# Patient Record
Sex: Female | Born: 1990 | Race: Black or African American | Hispanic: No | Marital: Single | Smoking: Current every day smoker
Health system: Southern US, Community
[De-identification: ages and names within clinical notes are randomized; demographics above are authoritative.]

## PROBLEM LIST (undated history)

## (undated) DIAGNOSIS — M069 Rheumatoid arthritis, unspecified: Secondary | ICD-10-CM

## (undated) DIAGNOSIS — M1990 Unspecified osteoarthritis, unspecified site: Secondary | ICD-10-CM

## (undated) HISTORY — PX: NO PAST SURGERIES: SHX2092

---

## 2010-10-20 ENCOUNTER — Emergency Department (HOSPITAL_COMMUNITY): Payer: Self-pay

## 2010-10-20 ENCOUNTER — Inpatient Hospital Stay (HOSPITAL_COMMUNITY)
Admission: EM | Admit: 2010-10-20 | Discharge: 2010-10-22 | DRG: 547 | Disposition: A | Discharge status: Home / Self Care | Payer: Self-pay | Attending: Internal Medicine | Admitting: Internal Medicine

## 2010-10-20 DIAGNOSIS — M069 Rheumatoid arthritis, unspecified: Principal | ICD-10-CM | POA: Diagnosis present

## 2010-10-20 DIAGNOSIS — Z9119 Patient's noncompliance with other medical treatment and regimen: Secondary | ICD-10-CM

## 2010-10-20 DIAGNOSIS — R269 Unspecified abnormalities of gait and mobility: Secondary | ICD-10-CM | POA: Diagnosis present

## 2010-10-20 DIAGNOSIS — M25569 Pain in unspecified knee: Secondary | ICD-10-CM | POA: Diagnosis present

## 2010-10-20 DIAGNOSIS — Z91199 Patient's noncompliance with other medical treatment and regimen due to unspecified reason: Secondary | ICD-10-CM

## 2010-10-20 LAB — GRAM STAIN

## 2010-10-20 LAB — COMPREHENSIVE METABOLIC PANEL
ALT: 9 U/L (ref 0–35)
AST: 13 U/L (ref 0–37)
CO2: 26 mEq/L (ref 19–32)
Chloride: 101 mEq/L (ref 96–112)
Creatinine, Ser: 0.59 mg/dL (ref 0.50–1.10)
GFR calc Af Amer: >60 mL/min (ref >60)
GFR calc non Af Amer: >60 mL/min (ref >60)
Glucose, Bld: 95 mg/dL (ref 70–99)
Sodium: 136 mEq/L (ref 135–145)
Total Bilirubin: 0.2 mg/dL — ABNORMAL LOW (ref 0.3–1.2)

## 2010-10-20 LAB — GLUCOSE, CAPILLARY: Glucose-Capillary: 97 mg/dL (ref 70–99)

## 2010-10-20 LAB — SYNOVIAL CELL COUNT + DIFF, W/ CRYSTALS
Crystals, Fluid: NONE SEEN
Eosinophils-Synovial: 0 % (ref 0–1)
Lymphocytes-Synovial Fld: 12 % (ref 0–20)
Monocyte-Macrophage-Synovial Fluid: 13 % — ABNORMAL LOW (ref 50–90)

## 2010-10-20 LAB — CBC
Hemoglobin: 10.3 g/dL — ABNORMAL LOW (ref 12.0–15.0)
MCV: 74.6 fL — ABNORMAL LOW (ref 78.0–100.0)
Platelets: 441 10*3/uL — ABNORMAL HIGH (ref 150–400)
RBC: 4.53 MIL/uL (ref 3.87–5.11)
WBC: 6.7 10*3/uL (ref 4.0–10.5)

## 2010-10-20 LAB — POCT PREGNANCY, URINE: Preg Test, Ur: NEGATIVE

## 2010-10-20 LAB — SEDIMENTATION RATE: Sed Rate: 57 mm/hr — ABNORMAL HIGH (ref 0–22)

## 2010-10-21 LAB — RENAL FUNCTION PANEL
BUN: 7 mg/dL (ref 6–23)
CO2: 26 mEq/L (ref 19–32)
Calcium: 9.6 mg/dL (ref 8.4–10.5)
GFR calc Af Amer: >60 mL/min (ref >60)
Glucose, Bld: 124 mg/dL — ABNORMAL HIGH (ref 70–99)
Phosphorus: 2.1 mg/dL — ABNORMAL LOW (ref 2.3–4.6)

## 2010-10-21 LAB — GLUCOSE, CAPILLARY: Glucose-Capillary: 147 mg/dL — ABNORMAL HIGH (ref 70–99)

## 2010-10-21 LAB — SEDIMENTATION RATE: Sed Rate: >140 mm/hr — ABNORMAL HIGH (ref 0–22)

## 2010-10-22 LAB — GLUCOSE, CAPILLARY
Glucose-Capillary: 91 mg/dL (ref 70–99)
Glucose-Capillary: 105 mg/dL — ABNORMAL HIGH (ref 70–99)

## 2010-10-23 NOTE — Discharge Summary (Signed)
Caitlin Miles NO.:  [id number]  MEDICAL RECORD NO.:  [id number]  LOCATION:  1312                         FACILITY:  [facility]  PHYSICIAN:  Altha Harm, MDDATE OF BIRTH:  January 13, 1991  DATE OF ADMISSION:  10/20/2010 DATE OF DISCHARGE:  10/22/2010                              DISCHARGE SUMMARY   DISCHARGE DISPOSITION:  Home.  FINAL DISCHARGE DIAGNOSES: 1. Rheumatoid arthritis with inflammation of right knee. 2. Medication noncompliance. 3. Gait abnormality secondary to pain in the right knee.  DISCHARGE MEDICATIONS: 1. Folic acid 1 mg p.o. daily. 2. Plaquenil 200 mg p.o. daily. 3. Methotrexate 7.5 mg p.o. weekly on Tuesdays. 4. Tylenol 500 1-2 tablets p.o. daily for pain. 5. Oxycodone 5 mg 1-2 tablets p.o. q.6 h. p.r.n. pain. 6. Prednisone 5 mg p.o. daily.  CONSULTANTS:  Harvie Junior, M.D., Orthopedic Surgery.  PROCEDURES:  Intra-articular steroid injection.  DIAGNOSTIC STUDIES:  Three view x-ray of the knee done on admission, which shows joint effusion.  Impression:  Joint space narrowing involving both the medial and lateral compartments with a pattern compatible with an inflammatory arthropathy.  There is also likely component of osteoarthritis with marginal spur formation.  PERTINENT LABORATORY STUDIES:  ESR baseline is greater than 140.  Sodium 136, potassium 3.9, chloride 103, bicarb 26, BUN 7, creatinine 0.71. WBC 6.7, hemoglobin 10.3, hematocrit 33.8, platelet count 441.  Joint fluid showed red, cloudy fluid.  White blood cell count total of 21,614, neutrophils 75, lymphocytes 12, macrophage monocytes 13, eosinophils 0. Fluid Gram stain showed abundant WBCs present, no organisms seen.  CHIEF COMPLAINT:  Pain in the right knee with an inability to ambulate.  HISTORY OF PRESENT ILLNESS:  Please refer to the H and P by Dr. Nedra Hai for details of the HPI; however, in short, this is a 20 year old female with a history of rheumatoid  arthritis who has been noncompliant with the medications secondary to affordability.  Presented to the emergency room with pain and swelling of the right knee.  HOSPITAL COURSE: 1. The patient was admitted by Dr. Nedra Hai on observation basis, she was     started on IV steroids in addition to IV fluids.  The patient had     some decrease in her pain; however, based on the findings of the     joint fluid, it is clearly not infectious, was rather inflammatory     in nature.  I saw the patient on the day after her admission and     felt that the definitive treatment would be an intra-articular     joint injection with steroids.  I discussed this with Dr. Azzie Roup, Rheumatology, who agreed.  We consulted Dr. Luiz Blare and he     did an intra-articular joint injection today, and will follow the     patient in 2 weeks in the office.  Pertaining to her rheumatoid     arthritis in general, the patient had been off all her medications.     I started her back on Plaquenil 200 mg daily and methotrexate 7.5     mg weekly on Tuesdays.  The patient has been given Dr. Vincenza Hews  Anderson's number, he will see her in the office and she has been     instructed, both her and her mother that they must call Dr. Azzie Roup at the number provided, which is [phone] to call for     an appointment. 2. In terms of her gait pattern, the patient was seen and evaluated by     Physical Therapy and crutches have been issued.  The patient is     proficient in ambulation with crutches both on a leveled surface     and on stairs. 3. Psychosocial.  The patient's only problem that she has a chronic     disease without any assess for ongoing health care.  The patient     was seen by the financial counselor here and given advice on how to     proceed.  This patient actually meets disability criteria for at     least partial disability criteria and I have advised the patient     and her mother that they should  likely pursue that route.  CONDITION AT THE TIME OF DISCHARGE:  Stable.  PHYSICAL EXAMINATION:  VITAL SIGNS:  Temperature is 98.2, heart rate 56, blood pressure 105/66, respiratory rate 17, O2 sats 100% on room air. LUNGS:  Clear to auscultation.  No wheezing or rhonchi noted. CARDIOVASCULAR:  She has got a normal S1, S2.  No murmurs, rubs, or gallops noted.  PMI is nondisplaced.  No heaves or thrills on palpation. ABDOMEN:  Obese, soft, nontender, nondistended.  No masses.  No hepatosplenomegaly.  Right knee show some swelling, but, however, there is no warmth and erythema.  Post injection, the patient is able to move the knee with very little difficulty.  Otherwise, the patient is stable.  DIETARY RESTRICTIONS:  None.  PHYSICAL RESTRICTIONS:  Activity as tolerated.  FOLLOWUP:  The patient is to follow up with Dr. Azzie Roup next week and they are to call for the appointment as the number provided.  The patient is also to call Dr. Luiz Blare' office in followup in 2 weeks as she is instructed.  Total time for this discharge process including face-to-face time 40 minutes.     Altha Harm, MD     MAM/MEDQ  D:  10/22/2010  T:  10/23/2010  Job:  [id number]  cc:   Lurena Nida, MD Fax: [phone]  Harvie Junior, M.D. Fax: [phone]  Electronically Signed by Marthann Schiller MD on 10/23/2010 03:51:00 PM

## 2010-10-24 LAB — BODY FLUID CULTURE: Culture: NO GROWTH

## 2010-11-04 NOTE — Consult Note (Signed)
  NAME:  LAQUEENA, HINCHEY            ACCOUNT NO.:  [id number]  MEDICAL RECORD NO.:  [id number]  LOCATION:                                 FACILITY:  PHYSICIAN:  Harvie Junior, M.D.   DATE OF BIRTH:  [birth year]  DATE OF CONSULTATION: DATE OF DISCHARGE:                                CONSULTATION   INJECTION NOTE After careful discussion about the indications for injection therapy and understanding the risks of bleeding, infection, need for further injection, failure of the injection to cure her, was desirous of undergoing a right knee injection after careful prepping of the skin with Betadine and a needle was injected into the superior lateral portal and the medicine was injected, 8 mL of 0.25% Marcaine and 2 mL of 80 mg/cc Depo-Medrol was instilled into the knee.  We then put a bandage on the knee and observed her for 10 minutes to make sure she has not had problems with the injection.     Harvie Junior, M.D.     Ranae Plumber  D:  10/22/2010  T:  10/23/2010  Job:  [id number]  Electronically Signed by Jodi Geralds M.D. on 11/04/2010 04:54:09 PM

## 2010-11-04 NOTE — Consult Note (Signed)
Caitlin Miles, Caitlin Miles NO.:  [id number]  MEDICAL RECORD NO.:  [id number]  LOCATION:  1312                         FACILITY:  [facility]  PHYSICIAN:  Harvie Junior, M.D.   DATE OF BIRTH:  Mar 19, 1991  DATE OF CONSULTATION: DATE OF DISCHARGE:  10/22/2010                                CONSULTATION   HISTORY OF PRESENT ILLNESS:  She is a 20 year old female who was admitted to the hospitalist service 2 days ago with a right swollen knee.  She has a long history of having a diagnosis of inflammatory arthritis, specifically rheumatoid arthritis, and has been treated in the past with medications for same.  She had a significant flare-up of knee pain.  She was seen in the emergency room where she underwent aspiration of her knee.  For about one month, she has been unable to work because it has been bothering her so much.  She presented to the emergency room because of the chronic pain as well.  She denied any fevers or chills, nausea or vomiting, or other symptomatology.  She has not been seen by rheumatologist for several months due to financial issues, and she has had a workup in the emergency room, which included a normal white blood cell count, temperature 99.1, x-ray which showed a knee joint effusion suggestive of inflammatory process with a small component of osteoarthritic change.  She was ultimately aspirated which showed 21614 white cells, 75% neutrophils, 12 lymphs, 13 monocytes/macrophages, no eosinophils or other cells.  The Gram stain was negative on that aspiration.  She had a sed rate done at that time which was greater than 140, and she was admitted to the Hematology/Oncology service, and we are consulted for treatment of the knee at this point for further evaluation.  PAST MEDICAL HISTORY:  Remarkable for having rheumatoid arthritis as outlined above.  She has no other significant past medical history.  SOCIAL HISTORY:  She is single with no children.   She is not a smoker or drinker.  She works in __________.  MEDICATIONS:  She is supposed to be on Plaquenil, Enbrel, and methotrexate; but is unclear what she is taking relative to the financial issues.  ALLERGIES:  She is allergic to Naprosyn which causes her to have itching.  PHYSICAL EXAMINATION:  GENERAL:  She is alert and oriented x3. VITAL SIGNS:  Temperature is 97.9, blood pressure is 124/79, heart rate 58, respirations 20.  She has 100% O2 sat on room air. HEENT:  Within normal limits.  Her eyes are not dilated. LUNGS:  Clear to auscultation. HEART:  Regular rate and rhythm. ABDOMEN:  Soft and nontender.  She is not using accessory muscle respirations. EXTREMITIES:  The right knee shows a boggy feeling, synovitic knee with no significant effusion.  No instability.  No significant joint line tenderness.  There is pain through range of motion.  LABORATORY DATA:  X-rays of right knee show a joint effusion with some joint space narrowing involving both medial and lateral compartments. No evidence of fracture.  She has laboratory data which includes a white blood cell count of 6.7, sed rate of greater than 140.  Chem-7 shows normal except for a glucose of  124.  She has a total bili which is low at 0.2, and an albumin which is 3.2.  ASSESSMENT:  She is a 20 year old with known rheumatoid arthritis.  She has a flare-up of right knee pain, has been aspirated.  We had a long talk about treatment options, I feel the most appropriate course of action for her is going to be injection therapy.  __________ on the right knee with 8 of Marcaine and 2 of Kenalog, and we will plan on following her up in the office in 2 weeks.  A card was given to her if she has problems sooner then she knows to call, and she should watch for any kind of fevers, chills, or worsening pain; and if she has anything she knows to call us.  I will plan on seeing her back in the office in 2 weeks.     Harvie Junior, M.D.     Ranae Plumber  D:  10/22/2010  T:  10/23/2010  Job:  [id number]  Electronically Signed by Jodi Geralds M.D. on 11/04/2010 06:28:05 PM

## 2010-11-12 NOTE — H&P (Signed)
NAMESHUNTIA, EXTON NO.:  [id number]  MEDICAL RECORD NO.:  [id number]  LOCATION:  WLED                         FACILITY:  [facility]  PHYSICIAN:  Houston Siren, MD           DATE OF BIRTH:  10-25-1990  DATE OF ADMISSION:  10/20/2010 DATE OF DISCHARGE:                             HISTORY & PHYSICAL   PRIMARY CARE PHYSICIAN:  Dr. Janace Litten Dayal  in [place].  ADVANCE DIRECTIVE:  Full code.  REASON FOR ADMISSION:  Noncompliant with medication and having exacerbation of her rheumatoid arthritis.  HISTORY OF PRESENT ILLNESS:  This is a 20 year old female with history of rheumatoid arthritis, noncompliant with her medications, having swelling of her right knee for 1 month (able to work up to 2 weeks ago at [employer]), presents to the emergency room at the insistence of her roommate and her mother because of the chronic pain and swelling of her right knee.  She denied fever, chills, nausea or vomiting, or any other symptomatology.  She had not seen her rheumatologist for several months due to her financial constraints.  Workup in the emergency room included a normal white count and temp of 99.1.  Right knee x-ray showed joint effusion suggestive of inflammatory process, but this has also a small component of osteoarthritic changes as well.  The right knee was aspirated.  Hospitalist was asked to admit the patient.  PAST MEDICAL HISTORY:  As above.  SOCIAL HISTORY:  She is single, has no children.  She denied tobacco, alcohol, or drug use.  She works at [employer].  MEDICATIONS:  She is supposed to be on Plaquenil, Enbrel, and methotrexate.  ALLERGIES:  NAPROXEN causing itching.  PHYSICAL EXAMINATION:  VITAL SIGNS:  Temperature 99.1, blood pressure 120/70, pulse is 75, respiratory rate is 16. GENERAL:  She is alert and oriented and is in no apparent distress.  She does not look particularly in great pain. HEENT:  Sclerae nonicteric.  Thyroid is not enlarged.  Throat is  clear. NECK:  Supple. CARDIAC:  S1 and S2 regular.  No murmur, rub, or gallop. LUNGS:  Clear. ABDOMEN:  Soft, nondistended, nontender. EXTREMITIES:  Right knee with effusion, slightly warm, full range of motion.  No concomitant erythema of overlying skin.  She has no calf tenderness.  Good distal pulses bilaterally. SKIN:  Warm and dry.  No evidence of shock. NEUROLOGIC AND PSYCHIATRIC:  Unremarkable as well.  OBJECTIVE FINDINGS AND PERTINENT STUDIES:  White count of 6700, hemoglobin of 10.3, creatinine of 0.6.  Liver function tests are normal. X-ray showed joint effusion.  Aspiration study is pending.  IMPRESSION:  This is a 20 year old with rheumatoid arthritis, has been noncompliant, and experiencing a flare of her right knee.  If her aspiration is negative, she really does not need to be admitted, however, the emergency room physician insisted that I admit her, and so I did.  We will give one dose of Solu-Medrol at 62.5 mg and then will start her on prednisone at 60 mg.  While she is in the hospital, we will give her intravenous Dilaudid and intravenous fluids.  She is very stable, and while she is in the hospital, I will give  her prophylactic Lovenox.  We will admit her to [facility].     Houston Siren, MD     PL/MEDQ  D:  10/20/2010  T:  10/20/2010  Job:  [id number]  Electronically Signed by Houston Siren  on 11/12/2010 09:43:38 PM

## 2011-03-29 ENCOUNTER — Emergency Department (HOSPITAL_COMMUNITY)
Admission: EM | Admit: 2011-03-29 | Discharge: 2011-03-29 | Disposition: A | Discharge status: Home / Self Care | Payer: Self-pay | Attending: Emergency Medicine | Admitting: Emergency Medicine

## 2011-03-29 ENCOUNTER — Encounter: Payer: Self-pay | Admitting: Emergency Medicine

## 2011-03-29 DIAGNOSIS — M083 Juvenile rheumatoid polyarthritis (seronegative): Secondary | ICD-10-CM | POA: Insufficient documentation

## 2011-03-29 DIAGNOSIS — Z79899 Other long term (current) drug therapy: Secondary | ICD-10-CM | POA: Insufficient documentation

## 2011-03-29 DIAGNOSIS — M25569 Pain in unspecified knee: Secondary | ICD-10-CM | POA: Insufficient documentation

## 2011-03-29 DIAGNOSIS — M25469 Effusion, unspecified knee: Secondary | ICD-10-CM | POA: Insufficient documentation

## 2011-03-29 DIAGNOSIS — M069 Rheumatoid arthritis, unspecified: Secondary | ICD-10-CM

## 2011-03-29 DIAGNOSIS — M25461 Effusion, right knee: Secondary | ICD-10-CM

## 2011-03-29 HISTORY — DX: Unspecified osteoarthritis, unspecified site: M19.90

## 2011-03-29 MED ORDER — LIDOCAINE HCL 2 % IJ SOLN
2.0000 mL | Freq: Once | INTRAMUSCULAR | Status: AC
  Administered 2011-03-29: 2 mg

## 2011-03-29 MED ORDER — OXYCODONE-ACETAMINOPHEN 5-325 MG PO TABS: 2.0000 | ORAL_TABLET | ORAL | Status: AC | PRN

## 2011-03-29 MED ORDER — MORPHINE SULFATE 4 MG/ML IJ SOLN
4.0000 mg | Freq: Once | INTRAMUSCULAR | Status: AC
  Administered 2011-03-29: 4 mg via INTRAMUSCULAR
  Filled 2011-03-29: qty 1

## 2011-03-29 NOTE — ED Provider Notes (Signed)
History     CSN: [id number]  Arrival date & time 03/29/11  1310   First MD Initiated Contact with Patient 03/29/11 1534      No chief complaint on file.   (Consider location/radiation/quality/duration/timing/severity/associated sxs/prior treatment) The history is provided by the patient.   The patient is a 21 year old, female, with juvenile rheumatoid arthritis.  She presents to emergency department complaining of right knee pain and swelling for several days.  She denies trauma.  She denies fever or rash.  She has had it aspirated in the past.  She has not changed any medications recently.   No past surgical history on file.  No family history on file.  History  Substance Use Topics  . Smoking status: Not on file  . Smokeless tobacco: Not on file  . Alcohol Use: Not on file    OB History    No data available      Review of Systems  Constitutional: Negative for fever and chills.  Cardiovascular: Negative for leg swelling.  Musculoskeletal: Positive for joint swelling.       Right knee pain and swelling     Allergies  Naproxen  Home Medications   Current Outpatient Rx  Name Route Sig Dispense Refill  . ETANERCEPT 50 MG/ML [place] SOLN Subcutaneous Inject 50 mg into the skin once a week. Taken on Wednesdays.     Marland Kitchen HYDROXYCHLOROQUINE SULFATE 200 MG PO TABS Oral Take 200 mg by mouth daily.      . IBUPROFEN 200 MG PO TABS Oral Take 200-400 mg by mouth every 8 (eight) hours as needed. For pain.     Marland Kitchen METHOTREXATE 2.5 MG PO TABS Oral Take 25 mg by mouth once a week. Caution:Chemotherapy. Protect from light.  Taken on Fridays.     Marland Kitchen PREDNISONE 5 MG PO TABS Oral Take 5 mg by mouth daily.        There were no vitals taken for this visit.  Physical Exam  Constitutional: She is oriented to person, place, and time. She appears well-developed and well-nourished.  HENT:  Head: Normocephalic and atraumatic.  Eyes: Conjunctivae are normal. Pupils are equal, round, and reactive  to light.  Neck: Normal range of motion.  Pulmonary/Chest: Effort normal.  Abdominal: She exhibits no distension.  Musculoskeletal: Normal range of motion. She exhibits edema and tenderness.       Right knee swelling, tenderness and effusion.  Neurological: She is alert and oriented to person, place, and time.  Skin: Skin is warm and dry. No rash noted. No erythema.  Psychiatric: She has a normal mood and affect.    ED Course  Procedures (including critical care time) Rheumatoid arthritis, with right knee effusion.  No perform a arthrocentesis of her right knee and treat with increased analgesics as needed.  Labs Reviewed - No data to display   ARTHROCENTESIS Sterile prep with betadine. Local anesthesia - 2 cc lido without epi Needle aspiration of 70 cc yellow aspirate No complications Pt felt better after removal of effusion   MDM  Rheumatoid arthritis, with right knee effusion Resolved after arthrocentesis       Nicholes Stairs, MD 03/29/11 1611

## 2011-03-29 NOTE — ED Notes (Signed)
Patient here with ongoing right knee pain and swelling due to rheumatoid arthritis. No new injury-needs pain meds

## 2011-10-02 ENCOUNTER — Encounter (HOSPITAL_COMMUNITY): Payer: Self-pay

## 2011-10-02 ENCOUNTER — Inpatient Hospital Stay (HOSPITAL_COMMUNITY)
Admission: AD | Admit: 2011-10-02 | Discharge: 2011-10-02 | Disposition: A | Discharge status: Home / Self Care | Payer: Self-pay | Source: Ambulatory Visit | Attending: Obstetrics & Gynecology | Admitting: Obstetrics & Gynecology

## 2011-10-02 DIAGNOSIS — D649 Anemia, unspecified: Secondary | ICD-10-CM

## 2011-10-02 DIAGNOSIS — N970 Female infertility associated with anovulation: Secondary | ICD-10-CM

## 2011-10-02 DIAGNOSIS — N938 Other specified abnormal uterine and vaginal bleeding: Secondary | ICD-10-CM | POA: Insufficient documentation

## 2011-10-02 DIAGNOSIS — N949 Unspecified condition associated with female genital organs and menstrual cycle: Secondary | ICD-10-CM

## 2011-10-02 HISTORY — DX: Rheumatoid arthritis, unspecified: M06.9

## 2011-10-02 LAB — WET PREP, GENITAL: Yeast Wet Prep HPF POC: NONE SEEN

## 2011-10-02 LAB — CBC
MCHC: 30.7 g/dL (ref 30.0–36.0)
RDW: 18.4 % — ABNORMAL HIGH (ref 11.5–15.5)

## 2011-10-02 MED ORDER — MEDROXYPROGESTERONE ACETATE 10 MG PO TABS: 10.0000 mg | ORAL_TABLET | Freq: Every day | ORAL | Status: DC

## 2011-10-02 MED ORDER — PROMETHAZINE HCL 25 MG PO TABS: 25.0000 mg | ORAL_TABLET | Freq: Four times a day (QID) | ORAL | Status: DC | PRN

## 2011-10-02 NOTE — MAU Provider Note (Signed)
History     CSN: [id number]  Arrival date and time: 10/02/11 1230   None     Chief Complaint  Patient presents with  . Vaginal Bleeding  . Possible Pregnancy   HPI This is a 21 y.o. female who presents with c/o heavy menstrual bleeding for 2 weeks. Denies significant pain with this. Has never had this happen before. Does have a history of some irregular periods.   RN NOTE:  Patient states she started her period on 7-1 and continues to have light bleeding every day. No pain.       OB History    Grav Para Term Preterm Abortions TAB SAB Ect Mult Living   1 0 0 0 1 1 0 0 0 0       Past Medical History  Diagnosis Date  . Arthritis   . Rheumatoid arthritis     Past Surgical History  Procedure Date  . No past surgeries     Family History  Problem Relation Age of Onset  . Rheum arthritis Mother   . Other Neg Hx     History  Substance Use Topics  . Smoking status: Current Everyday Smoker -- 0.5 packs/day    Types: Cigarettes  . Smokeless tobacco: Not on file  . Alcohol Use: No    Allergies:  Allergies  Allergen Reactions  . Naproxen Swelling    Prescriptions prior to admission  Medication Sig Dispense Refill  . adalimumab (HUMIRA) 40 MG/0.8ML injection Inject 40 mg into the skin every 14 (fourteen) days.      . hydroxychloroquine (PLAQUENIL) 200 MG tablet Take 200 mg by mouth daily.        Marland Kitchen ibuprofen (ADVIL,MOTRIN) 200 MG tablet Take 200-400 mg by mouth every 8 (eight) hours as needed. For pain.       . methotrexate (RHEUMATREX) 2.5 MG tablet Take 25 mg by mouth once a week. Caution:Chemotherapy. Protect from light.  Take 6 tablets every Wed      . predniSONE (DELTASONE) 5 MG tablet Take 10 mg by mouth daily.         Review of Systems  Constitutional: Negative.   HENT: Negative.   Eyes: Negative.   Respiratory: Negative.   Cardiovascular: Negative.   Gastrointestinal:       Reports R sided abdominal pain radiating through to her back yesterday.   States is on DMARDs and Methotrexate.  Knows she is not supposed to get pregnant, but admits she has not used birth control for 3 years.  Genitourinary:       See HPI.  Musculoskeletal:       Patient has a history of RA, her rheumatologist is Dr. Dareen Piano here in [place].  Skin: Negative.   Neurological: Negative.   Endo/Heme/Allergies: Negative.   Psychiatric/Behavioral: Negative.    Physical Exam   Blood pressure 115/76, pulse 79, temperature 98.9 F (37.2 C), temperature source Oral, resp. rate 16, height 5\' 5"  (1.651 m), weight 67.223 kg (148 lb 3.2 oz), last menstrual period 09/22/2011, SpO2 100.00%.  Physical Exam  Constitutional: She is oriented to person, place, and time. She appears well-developed and well-nourished.  HENT:  Head: Normocephalic and atraumatic.  Neck: Normal range of motion. Neck supple.  Cardiovascular: Normal rate, regular rhythm, normal heart sounds and intact distal pulses.   GI: Soft. Bowel sounds are normal. She exhibits no distension and no mass. There is no tenderness. There is no rebound and no guarding.  Musculoskeletal: Normal range of motion.  No joint deformity or pain noted.  Neurological: She is alert and oriented to person, place, and time.  Skin: Skin is warm and dry.   Pelvic:  No active hemorrhage. Moderate blood in vault. Uterus normal size, nontender  MAU Course  Procedures  MDM   Results for orders placed during the hospital encounter of 10/02/11 (from the past 24 hour(s))  POCT PREGNANCY, URINE     Status: Normal   Collection Time   10/02/11  1:33 PM      Component Value Range   Preg Test, Ur NEGATIVE  NEGATIVE  WET PREP, GENITAL     Status: Abnormal   Collection Time   10/02/11  4:15 PM      Component Value Range   Yeast Wet Prep HPF POC NONE SEEN  NONE SEEN   Trich, Wet Prep NONE SEEN  NONE SEEN   Clue Cells Wet Prep HPF POC FEW (*) NONE SEEN   WBC, Wet Prep HPF POC FEW (*) NONE SEEN  CBC     Status: Abnormal    Collection Time   10/02/11  4:35 PM      Component Value Range   WBC 10.8 (*) 4.0 - 10.5 K/uL   RBC 4.60  3.87 - 5.11 MIL/uL   Hemoglobin 10.6 (*) 12.0 - 15.0 g/dL   HCT 96.0 (*) 45.4 - 09.8 %   MCV 75.0 (*) 78.0 - 100.0 fL   MCH 23.0 (*) 26.0 - 34.0 pg   MCHC 30.7  30.0 - 36.0 g/dL   RDW 11.9 (*) 14.7 - 82.9 %   Platelets 435 (*) 150 - 400 K/uL   Consulted with Wynelle Bourgeois CNM regarding plan of care.  Assessment and Plan   Assessment:  Anovulatory Menstrual Bleeding  Plan:  Provera 10mg  PO  daily x 10 days, told to expect withdrawal bleed. Phenergan 25 mg PO every 6-8 hours as needed for nausea. #30  Follow up or return here as needed. Seek a regular birth control method at [facility].      Servando Salina 10/02/2011, 5:27 PM   Seen and agree with note.

## 2011-10-02 NOTE — MAU Note (Signed)
Patient states she started her period on 7-1 and continues to have light bleeding every day. No pain.

## 2011-10-03 LAB — GC/CHLAMYDIA PROBE AMP, GENITAL: Chlamydia, DNA Probe: NEGATIVE

## 2012-07-12 ENCOUNTER — Encounter (HOSPITAL_COMMUNITY): Payer: Self-pay | Admitting: *Deleted

## 2012-07-12 ENCOUNTER — Emergency Department (HOSPITAL_COMMUNITY)
Admission: EM | Admit: 2012-07-12 | Discharge: 2012-07-12 | Disposition: A | Discharge status: Home / Self Care | Payer: Self-pay | Attending: Emergency Medicine | Admitting: Emergency Medicine

## 2012-07-12 ENCOUNTER — Emergency Department (HOSPITAL_COMMUNITY): Payer: Self-pay

## 2012-07-12 DIAGNOSIS — IMO0002 Reserved for concepts with insufficient information to code with codable children: Secondary | ICD-10-CM | POA: Insufficient documentation

## 2012-07-12 DIAGNOSIS — F17200 Nicotine dependence, unspecified, uncomplicated: Secondary | ICD-10-CM | POA: Insufficient documentation

## 2012-07-12 DIAGNOSIS — M25579 Pain in unspecified ankle and joints of unspecified foot: Secondary | ICD-10-CM | POA: Insufficient documentation

## 2012-07-12 DIAGNOSIS — Z79899 Other long term (current) drug therapy: Secondary | ICD-10-CM | POA: Insufficient documentation

## 2012-07-12 DIAGNOSIS — M069 Rheumatoid arthritis, unspecified: Secondary | ICD-10-CM | POA: Insufficient documentation

## 2012-07-12 DIAGNOSIS — M25571 Pain in right ankle and joints of right foot: Secondary | ICD-10-CM

## 2012-07-12 MED ORDER — ZOLPIDEM TARTRATE 5 MG PO TABS: 5.0000 mg | ORAL_TABLET | Freq: Every evening | ORAL | Status: DC | PRN

## 2012-07-12 MED ORDER — ADULT MULTIVITAMIN W/MINERALS CH: 1.0000 | ORAL_TABLET | Freq: Every day | ORAL | Status: DC

## 2012-07-12 MED ORDER — FOLIC ACID 1 MG PO TABS: 1.0000 mg | ORAL_TABLET | Freq: Every day | ORAL | Status: DC

## 2012-07-12 MED ORDER — ALUM & MAG HYDROXIDE-SIMETH 200-200-20 MG/5ML PO SUSP
30.0000 mL | ORAL | Status: DC | PRN
Start: 2012-07-12 — End: 2012-07-12

## 2012-07-12 MED ORDER — OXYCODONE-ACETAMINOPHEN 5-325 MG PO TABS
1.0000 | ORAL_TABLET | Freq: Once | ORAL | Status: AC
  Administered 2012-07-12: 1 via ORAL
  Filled 2012-07-12: qty 1

## 2012-07-12 MED ORDER — OXYCODONE-ACETAMINOPHEN 5-325 MG PO TABS: ORAL_TABLET | ORAL | Status: AC

## 2012-07-12 MED ORDER — LORAZEPAM 1 MG PO TABS: 0.0000 mg | ORAL_TABLET | Freq: Two times a day (BID) | ORAL | Status: DC

## 2012-07-12 MED ORDER — ONDANSETRON HCL 4 MG PO TABS: 4.0000 mg | ORAL_TABLET | Freq: Three times a day (TID) | ORAL | Status: DC | PRN

## 2012-07-12 MED ORDER — THIAMINE HCL 100 MG/ML IJ SOLN: 100.0000 mg | Freq: Every day | INTRAMUSCULAR | Status: DC

## 2012-07-12 MED ORDER — VITAMIN B-1 100 MG PO TABS: 100.0000 mg | ORAL_TABLET | Freq: Every day | ORAL | Status: DC

## 2012-07-12 MED ORDER — LORAZEPAM 2 MG/ML IJ SOLN: 1.0000 mg | Freq: Four times a day (QID) | INTRAMUSCULAR | Status: DC | PRN

## 2012-07-12 MED ORDER — LORAZEPAM 1 MG PO TABS: 0.0000 mg | ORAL_TABLET | Freq: Four times a day (QID) | ORAL | Status: DC

## 2012-07-12 MED ORDER — NICOTINE 21 MG/24HR TD PT24: 21.0000 mg | MEDICATED_PATCH | Freq: Every day | TRANSDERMAL | Status: DC

## 2012-07-12 MED ORDER — LORAZEPAM 1 MG PO TABS: 1.0000 mg | ORAL_TABLET | Freq: Four times a day (QID) | ORAL | Status: DC | PRN

## 2012-07-12 NOTE — ED Provider Notes (Signed)
History    This chart was scribed for non-physician practitioner Wynetta Emery, PA-C working with Celene Kras, MD by Gerlean Ren, ED Scribe. This patient was seen in room WTR9/WTR9 and the patient's care was started at 6:16 PM.    CSN: [id number]  Arrival date & time 07/12/12  1637   First MD Initiated Contact with Patient 07/12/12 1723      Chief Complaint  Patient presents with  . Ankle Pain     The history is provided by the patient. No language interpreter was used.  Caitlin Miles is a 22 y.o. female who presents to the Emergency Department complaining of right ankle pain RA flare up beginning at 5:00 AM this morning noticed when pt was unable to bear weight.  Pain rated as 8/10 and similar in quality to prior RA flare ups.  No recent falls, injuries, or traumas.  Pt has next appointment with rheumatologist 05/14.    Past Medical History  Diagnosis Date  . Arthritis   . Rheumatoid arthritis     Past Surgical History  Procedure Laterality Date  . No past surgeries      Family History  Problem Relation Age of Onset  . Rheum arthritis Mother   . Other Neg Hx     History  Substance Use Topics  . Smoking status: Current Every Day Smoker -- 0.50 packs/day    Types: Cigarettes  . Smokeless tobacco: Never Used  . Alcohol Use: No    OB History   Grav Para Term Preterm Abortions TAB SAB Ect Mult Living   1 0 0 0 1 1 0 0 0 0       Review of Systems  Constitutional: Negative for fever.  Respiratory: Negative for shortness of breath.   Cardiovascular: Negative for chest pain.  Gastrointestinal: Negative for nausea, vomiting, abdominal pain and diarrhea.  Musculoskeletal: Positive for arthralgias (right ankle, chronic).  All other systems reviewed and are negative.    Allergies  Naproxen  Home Medications   Current Outpatient Rx  Name  Route  Sig  Dispense  Refill  . ibuprofen (ADVIL,MOTRIN) 200 MG tablet   Oral   Take 200-400 mg by mouth every 8 (eight)  hours as needed. For pain.          . Methotrexate, Anti-Rheumatic, (METHOTREXATE, PF, [place])   Subcutaneous   Inject 6 mLs into the skin once a week. On tuesdays         . predniSONE (DELTASONE) 5 MG tablet   Oral   Take 10 mg by mouth every morning.            BP 122/71  Pulse 71  Temp(Src) 99 F (37.2 C) (Oral)  Resp 18  Ht 5\' 5"  (1.651 m)  Wt 135 lb (61.236 kg)  BMI 22.47 kg/m2  SpO2 100%  LMP 07/08/2012  Physical Exam  Nursing note and vitals reviewed. Constitutional: She is oriented to person, place, and time. She appears well-developed and well-nourished. No distress.  HENT:  Head: Normocephalic.  Eyes: Conjunctivae and EOM are normal.  Cardiovascular: Normal rate.   Pulmonary/Chest: Effort normal. No stridor.  Abdominal: Soft. Bowel sounds are normal.  Musculoskeletal: Normal range of motion.  Patient and relates with a mildly antalgic gait.   No swelling, erythema or tenderness to palpation of the right ankle or heel. Patient states the pain is worse in the distal Achilles tendon.   Neurovascularly intact  Neurological: She is alert and oriented to person, place,  and time.  Psychiatric: She has a normal mood and affect.    ED Course  Procedures (including critical care time) DIAGNOSTIC STUDIES: Oxygen Saturation is 100% on room air, normal by my interpretation.    COORDINATION OF CARE: 6:19 PM- Informed pt that I will provide pain relief and pain medicine prescription.  Informed pt that the flare up will likely pass and that resting the ankle will help with pain relief.  Advised pt to use crutches at home.  Pt verbalizes understanding and agrees with plan.     Dg Ankle Complete Right  07/12/2012  *RADIOLOGY REPORT*  Clinical Data: Rheumatoid arthritis.  Ankle pain.  RIGHT ANKLE - COMPLETE 3+ VIEW  Comparison: None.  Findings: Plafond and talar dome intact.  Mild soft tissue swelling noted just distal to both malleoli.  There is dorsal spurring of the  talar head which may be degenerative but can also occasionally be encountered in the setting of tarsal coalition.  Bony demineralization is present.  As imaged, Boehler's angle is only 12 degrees, and this can be a sign of calcaneal fracture although might possibly be projectional.  Degenerative midfoot arthropathy noted.  Suspected tibiotalar joint effusion.  IMPRESSION:  1.  Boehler's angle is abnormally low, which can be indicative of a calcaneal fracture.  Correlate with patient history in deciding the need for cross sectional imaging. 2.  Tibiotalar joint effusion. 3.  Dorsal spurring of the talar head, possibly degenerative although similar spurring can occasionally be encountered in the setting of tarsal coalition.  Calcaneonavicular and anterior subtalar coalition cannot be totally excluded. 4.  Degenerative midfoot arthropathy. 5.  Mild soft tissue swelling below both malleoli.   Original Report Authenticated By: Gaylyn Rong, M.D.      1. Ankle joint pain, right   2. RA (rheumatoid arthritis)       MDM   Caitlin Miles is a 22 y.o. female with right ankle pain, no trauma, history of rheumatoid arthritis. X-ray shows abnormally low Boehler's angle, I discussed this with attending physician Dr. Lynelle Doctor who feels that calcaneal fracture and is unlike any believe given the lack of injury and no tenderness to palpation on exam. Encourage the patient to continue with her methotrexate and prednisone and will add on Percocet. She will follow with her rheumatologist this week, if symptoms persist.   Patient states that she has crutches at home, I have advised her to use these at all times, until pain subsides with weight bearing   Filed Vitals:   07/12/12 1703  BP: 122/71  Pulse: 71  Temp: 99 F (37.2 C)  TempSrc: Oral  Resp: 18  Height: 5\' 5"  (1.651 m)  Weight: 135 lb (61.236 kg)  SpO2: 100%     Pt verbalized understanding and agrees with care plan. Outpatient follow-up and return  precautions given.    Discharge Medication List as of 07/12/2012  6:24 PM    START taking these medications   Details  oxyCODONE-acetaminophen (PERCOCET/ROXICET) 5-325 MG per tablet 1 to 2 tabs PO q6hrs  PRN for pain, Print        I personally performed the services described in this documentation, which was scribed in my presence. The recorded information has been reviewed and is accurate.     Wynetta Emery, PA-C 07/13/12 0106

## 2012-07-12 NOTE — ED Notes (Signed)
Pt states has a hx of RA, woke up this morning and could not stand on R ankle, complaining of R ankle pain 8/10, walked back to triage room.

## 2012-07-13 NOTE — ED Provider Notes (Signed)
Medical screening examination/treatment/procedure(s) were performed by non-physician practitioner and as supervising physician I was immediately available for consultation/collaboration.    Celene Kras, MD 07/13/12 1102

## 2014-01-23 ENCOUNTER — Encounter (HOSPITAL_COMMUNITY): Payer: Self-pay | Admitting: *Deleted

## 2014-04-01 ENCOUNTER — Inpatient Hospital Stay (HOSPITAL_COMMUNITY)
Admission: AD | Admit: 2014-04-01 | Discharge: 2014-04-01 | Disposition: A | Discharge status: Home / Self Care | Payer: Self-pay | Source: Ambulatory Visit | Attending: Family Medicine | Admitting: Family Medicine

## 2014-04-01 ENCOUNTER — Encounter (HOSPITAL_COMMUNITY): Payer: Self-pay

## 2014-04-01 DIAGNOSIS — L02411 Cutaneous abscess of right axilla: Secondary | ICD-10-CM | POA: Insufficient documentation

## 2014-04-01 DIAGNOSIS — L02419 Cutaneous abscess of limb, unspecified: Secondary | ICD-10-CM

## 2014-04-01 DIAGNOSIS — F17210 Nicotine dependence, cigarettes, uncomplicated: Secondary | ICD-10-CM | POA: Insufficient documentation

## 2014-04-01 MED ORDER — SULFAMETHOXAZOLE-TRIMETHOPRIM 800-160 MG PO TABS: 1.0000 | ORAL_TABLET | Freq: Two times a day (BID) | ORAL | Status: AC

## 2014-04-01 NOTE — Discharge Instructions (Signed)
Abscess °An abscess is an infected area that contains a collection of pus and debris. It can occur in almost any part of the body. An abscess is also known as a furuncle or boil. °CAUSES  °An abscess occurs when tissue gets infected. This can occur from blockage of oil or sweat glands, infection of hair follicles, or a minor injury to the skin. As the body tries to fight the infection, pus collects in the area and creates pressure under the skin. This pressure causes pain. People with weakened immune systems have difficulty fighting infections and get certain abscesses more often.  °SYMPTOMS °Usually an abscess develops on the skin and becomes a painful mass that is red, warm, and tender. If the abscess forms under the skin, you may feel a moveable soft area under the skin. Some abscesses break open (rupture) on their own, but most will continue to get worse without care. The infection can spread deeper into the body and eventually into the bloodstream, causing you to feel ill.  °DIAGNOSIS  °Your caregiver will take your medical history and perform a physical exam. A sample of fluid may also be taken from the abscess to determine what is causing your infection. °TREATMENT  °Your caregiver may prescribe antibiotic medicines to fight the infection. However, taking antibiotics alone usually does not cure an abscess. Your caregiver may need to make a small cut (incision) in the abscess to drain the pus. In some cases, gauze is packed into the abscess to reduce pain and to continue draining the area. °HOME CARE INSTRUCTIONS  °· Only take over-the-counter or prescription medicines for pain, discomfort, or fever as directed by your caregiver. °· If you were prescribed antibiotics, take them as directed. Finish them even if you start to feel better. °· If gauze is used, follow your caregiver's directions for changing the gauze. °· To avoid spreading the infection: °· Keep your draining abscess covered with a  bandage. °· Wash your hands well. °· Do not share personal care items, towels, or whirlpools with others. °· Avoid skin contact with others. °· Keep your skin and clothes clean around the abscess. °· Keep all follow-up appointments as directed by your caregiver. °SEEK MEDICAL CARE IF:  °· You have increased pain, swelling, redness, fluid drainage, or bleeding. °· You have muscle aches, chills, or a general ill feeling. °· You have a fever. °MAKE SURE YOU:  °· Understand these instructions. °· Will watch your condition. °· Will get help right away if you are not doing well or get worse. °Document Released: 12/18/2004 Document Revised: 09/09/2011 Document Reviewed: 05/23/2011 °ExitCare® Patient Information ©2015 [employer]. This information is not intended to replace advice given to you by your health care provider. Make sure you discuss any questions you have with your health care provider. ° °Abscess °Care After °An abscess (also called a boil or furuncle) is an infected area that contains a collection of pus. Signs and symptoms of an abscess include pain, tenderness, redness, or hardness, or you may feel a moveable soft area under your skin. An abscess can occur anywhere in the body. The infection may spread to surrounding tissues causing cellulitis. A cut (incision) by the surgeon was made over your abscess and the pus was drained out. Gauze may have been packed into the space to provide a drain that will allow the cavity to heal from the inside outwards. The boil may be painful for 5 to 7 days. Most people with a boil do not have   high fevers. Your abscess, if seen early, may not have localized, and may not have been lanced. If not, another appointment may be required for this if it does not get better on its own or with medications. °HOME CARE INSTRUCTIONS  °· Only take over-the-counter or prescription medicines for pain, discomfort, or fever as directed by your caregiver. °· When you bathe, soak and then  remove gauze or iodoform packs at least daily or as directed by your caregiver. You may then wash the wound gently with mild soapy water. Repack with gauze or do as your caregiver directs. °SEEK IMMEDIATE MEDICAL CARE IF:  °· You develop increased pain, swelling, redness, drainage, or bleeding in the wound site. °· You develop signs of generalized infection including muscle aches, chills, fever, or a general ill feeling. °· An oral temperature above 102° F (38.9° C) develops, not controlled by medication. °See your caregiver for a recheck if you develop any of the symptoms described above. If medications (antibiotics) were prescribed, take them as directed. °Document Released: 09/26/2004 Document Revised: 06/02/2011 Document Reviewed: 05/24/2007 °ExitCare® Patient Information ©2015 [employer]. This information is not intended to replace advice given to you by your health care provider. Make sure you discuss any questions you have with your health care provider. ° °

## 2014-04-01 NOTE — MAU Note (Signed)
Pt presents complaining of a boil under her right arm in her armpit. States this happened 6 years ago and it needed to be lanced. Wants boil lanced again

## 2014-04-01 NOTE — MAU Provider Note (Signed)
  History     CSN: [id number]  Arrival date and time: 04/01/14 1427   First Provider Initiated Contact with Patient 04/01/14 1535      Chief Complaint  Patient presents with  . Mass   HPI Comments: Caitlin Miles 24 y.o. G1P0010 presents to MAU with abscess under right arm that has been ongoing for one week. The discomfort has been keeping her awake at night. She has been using warm soaks and that has helped some. She has RA and is on Methotrexate. She has had similar abscess 6 years ago that was drained and resolved without issue.      Past Medical History  Diagnosis Date  . Arthritis   . Rheumatoid arthritis(714.0)     Past Surgical History  Procedure Laterality Date  . No past surgeries      Family History  Problem Relation Age of Onset  . Rheum arthritis Mother   . Other Neg Hx     History  Substance Use Topics  . Smoking status: Current Every Day Smoker -- 0.50 packs/day    Types: Cigarettes  . Smokeless tobacco: Never Used  . Alcohol Use: No    Allergies:  Allergies  Allergen Reactions  . Naproxen Swelling    Prescriptions prior to admission  Medication Sig Dispense Refill Last Dose  . ibuprofen (ADVIL,MOTRIN) 200 MG tablet Take 200-400 mg by mouth every 8 (eight) hours as needed. For pain.    03/31/2014 at Unknown time  . medroxyPROGESTERone (DEPO-PROVERA) 150 MG/ML injection Inject 150 mg into the muscle every 3 (three) months.   Nov. 2015  . methotrexate 25 MG/ML SOLN Inject 25 mg into the skin once a week. 1 cc SQ ONCE a WEEK   03/27/2014  . predniSONE (DELTASONE) 5 MG tablet Take 10 mg by mouth every morning.    04/01/2014 at Unknown time  . oxyCODONE-acetaminophen (PERCOCET/ROXICET) 5-325 MG per tablet 1 to 2 tabs PO q6hrs  PRN for pain (Patient not taking: Reported on 04/01/2014) 15 tablet 0     Review of Systems  Constitutional: Negative.   HENT: Negative.   Respiratory: Negative.   Cardiovascular: Negative.   Gastrointestinal: Negative.    Genitourinary: Negative.   Musculoskeletal: Negative.   Skin:       Abscess under right arm  Neurological: Negative.   Psychiatric/Behavioral: Negative.    Physical Exam   Blood pressure 123/73, pulse 88, temperature 98 F (36.7 C), temperature source Oral, resp. rate 18, last menstrual period 03/31/2014.  Physical Exam  Constitutional: She appears well-developed and well-nourished. No distress.  HENT:  Head: Normocephalic and atraumatic.  Skin:  Right axilla approx 5cm raised abscess that is tender    Procedure I&D Abscess Right Axilla  Abscess was cleaned x 3 with betadine Injected with 2 cc lidocaine 1cm incision made superficially Large amount exudate drained from wound Wound culture obtained Cleaned and sterile dressing applied  MAU Course  Procedures  MDM  wound culture Reviewed with Dr Shawnie Pons  Assessment and Plan   A: Abscess under right axilla  P: I&D with wound cultures Septra DS po BID x 10 days Advised any sign of rash to discontinue Warm soaks/ motrin for pain Return to MAU as needed    Carolynn Serve 04/01/2014, 4:14 PM

## 2014-04-04 ENCOUNTER — Telehealth: Payer: Self-pay | Admitting: Nurse Practitioner

## 2014-04-04 DIAGNOSIS — L0291 Cutaneous abscess, unspecified: Secondary | ICD-10-CM | POA: Insufficient documentation

## 2014-04-04 LAB — WOUND CULTURE

## 2014-04-04 NOTE — Telephone Encounter (Signed)
Caitlin Miles 23 y.o. called to advise we needed to switch her antibiotic based on cultures. She states she never started the septra and the abscess is completely healed and she did not want to take any antibiotic. Delbert Phenix, NP

## 2014-11-07 ENCOUNTER — Encounter (HOSPITAL_COMMUNITY): Payer: Self-pay | Admitting: Emergency Medicine

## 2014-11-07 ENCOUNTER — Emergency Department (HOSPITAL_COMMUNITY)
Admission: EM | Admit: 2014-11-07 | Discharge: 2014-11-07 | Disposition: A | Discharge status: Home / Self Care | Payer: BLUE CROSS/BLUE SHIELD | Attending: Emergency Medicine | Admitting: Emergency Medicine

## 2014-11-07 DIAGNOSIS — M069 Rheumatoid arthritis, unspecified: Secondary | ICD-10-CM | POA: Insufficient documentation

## 2014-11-07 DIAGNOSIS — Z720 Tobacco use: Secondary | ICD-10-CM | POA: Diagnosis not present

## 2014-11-07 DIAGNOSIS — K088 Other specified disorders of teeth and supporting structures: Secondary | ICD-10-CM | POA: Insufficient documentation

## 2014-11-07 DIAGNOSIS — K029 Dental caries, unspecified: Secondary | ICD-10-CM | POA: Insufficient documentation

## 2014-11-07 DIAGNOSIS — K0889 Other specified disorders of teeth and supporting structures: Secondary | ICD-10-CM

## 2014-11-07 MED ORDER — CLINDAMYCIN HCL 150 MG PO CAPS: 150.0000 mg | ORAL_CAPSULE | Freq: Four times a day (QID) | ORAL | Status: AC

## 2014-11-07 NOTE — ED Notes (Signed)
Pt reports right ear and dental pain x 2 days. Denies drainage, cough or fever.

## 2014-11-07 NOTE — ED Provider Notes (Signed)
CSN: [id number]     Arrival date & time 11/07/14  1709 History   None    No chief complaint on file.    (Consider location/radiation/quality/duration/timing/severity/associated sxs/prior Treatment) Patient is a 24 y.o. female presenting with tooth pain. The history is provided by the patient. No language interpreter was used.  Dental Pain Location:  Upper and lower Upper teeth location:  16/LU 3rd molar and 3/RU 1st molar Lower teeth location:  32/RL 3rd molar and 17/LL 3rd molar Quality:  Constant Severity:  Moderate Onset quality:  Gradual Timing:  Constant Chronicity:  New Context: dental caries   Relieved by:  Nothing Worsened by:  Nothing tried Ineffective treatments:  None tried Risk factors: sufficient dental care   Pt's primary is referring her to a dentist.  Pt has RA  Past Medical History  Diagnosis Date  . Arthritis   . Rheumatoid arthritis(714.0)    Past Surgical History  Procedure Laterality Date  . No past surgeries     Family History  Problem Relation Age of Onset  . Rheum arthritis Mother   . Other Neg Hx    Social History  Substance Use Topics  . Smoking status: Current Every Day Smoker -- 0.50 packs/day    Types: Cigarettes  . Smokeless tobacco: Never Used  . Alcohol Use: No   OB History    Gravida Para Term Preterm AB TAB SAB Ectopic Multiple Living   1 0 0 0 1 1 0 0 0 0      Review of Systems  All other systems reviewed and are negative.     Allergies  Naproxen  Home Medications   Prior to Admission medications   Medication Sig Start Date End Date Taking? Authorizing Provider  medroxyPROGESTERone (DEPO-PROVERA) 150 MG/ML injection Inject 150 mg into the muscle every 3 (three) months.    Historical Provider, MD  methotrexate 25 MG/ML SOLN Inject 25 mg into the skin once a week. 1 cc SQ ONCE a WEEK 01/26/14   Historical Provider, MD  oxyCODONE-acetaminophen (PERCOCET/ROXICET) 5-325 MG per tablet 1 to 2 tabs PO q6hrs  PRN for  pain Patient not taking: Reported on 04/01/2014 07/12/12   07/14/12 [place], PA-C  predniSONE (DELTASONE) 5 MG tablet Take 10 mg by mouth every morning.     Historical Provider, MD  sulfamethoxazole-trimethoprim (SEPTRA DS) 800-160 MG per tablet Take 1 tablet by mouth every 12 (twelve) hours. 04/01/14   05/31/14, NP   There were no vitals taken for this visit. Physical Exam  Constitutional: She appears well-developed and well-nourished.  HENT:  Head: Normocephalic and atraumatic.  Severe decay, swelling around right lower 3rd molar  Eyes: Pupils are equal, round, and reactive to light.  Cardiovascular: Normal rate.   Pulmonary/Chest: Effort normal.  Neurological: She is alert.  Skin: Skin is warm.  Psychiatric: She has a normal mood and affect.  Vitals reviewed.   ED Course  Procedures (including critical care time) Labs Review Labs Reviewed - No data to display  Imaging Review No results found. I have personally reviewed and evaluated these images and lab results as part of my medical decision-making.   EKG Interpretation None      MDM   Final diagnoses:  Tooth ache    clindamycin See your Dentist as scheduled avs   Delbert Phenix, PA-C 11/07/14 1744  11/09/14, MD 11/08/14 1251

## 2014-11-07 NOTE — Discharge Instructions (Signed)

## 2015-03-27 ENCOUNTER — Emergency Department (HOSPITAL_COMMUNITY)
Admission: EM | Admit: 2015-03-27 | Discharge: 2015-03-27 | Disposition: A | Discharge status: Home / Self Care | Payer: Medicaid Other | Attending: Emergency Medicine | Admitting: Emergency Medicine

## 2015-03-27 ENCOUNTER — Encounter (HOSPITAL_COMMUNITY): Payer: Self-pay | Admitting: Emergency Medicine

## 2015-03-27 DIAGNOSIS — M25461 Effusion, right knee: Secondary | ICD-10-CM

## 2015-03-27 DIAGNOSIS — Z7952 Long term (current) use of systemic steroids: Secondary | ICD-10-CM | POA: Insufficient documentation

## 2015-03-27 DIAGNOSIS — Z792 Long term (current) use of antibiotics: Secondary | ICD-10-CM | POA: Insufficient documentation

## 2015-03-27 DIAGNOSIS — M1990 Unspecified osteoarthritis, unspecified site: Secondary | ICD-10-CM | POA: Insufficient documentation

## 2015-03-27 DIAGNOSIS — M069 Rheumatoid arthritis, unspecified: Secondary | ICD-10-CM | POA: Insufficient documentation

## 2015-03-27 DIAGNOSIS — F17210 Nicotine dependence, cigarettes, uncomplicated: Secondary | ICD-10-CM | POA: Insufficient documentation

## 2015-03-27 MED ORDER — LIDOCAINE HCL 1 % IJ SOLN
INTRAMUSCULAR | Status: AC
  Administered 2015-03-27: 20 mL
  Filled 2015-03-27: qty 20

## 2015-03-27 MED ORDER — LIDOCAINE HCL (PF) 1 % IJ SOLN: 5.0000 mL | Freq: Once | INTRAMUSCULAR | Status: DC

## 2015-03-27 NOTE — Discharge Instructions (Signed)
Please follow with your primary care doctor in the next 2 days for a check-up. They must obtain records for further management.   Do not hesitate to return to the Emergency Department for any new, worsening or concerning symptoms.    Knee Effusion Knee effusion means that you have excess fluid in your knee joint. This can cause pain and swelling in your knee. This may make your knee more difficult to bend and move. That is because there is increased pain and pressure in the joint. If there is fluid in your knee, it often means that something is wrong inside your knee, such as severe arthritis, abnormal inflammation, or an infection. Another common cause of knee effusion is an injury to the knee muscles, ligaments, or cartilage. HOME CARE INSTRUCTIONS  Use crutches as directed by your health care provider.  Wear a knee brace as directed by your health care provider.  Apply ice to the swollen area:  Put ice in a plastic bag.  Place a towel between your skin and the bag.  Leave the ice on for 20 minutes, 2-3 times per day.  Keep your knee raised (elevated) when you are sitting or lying down.  Take medicines only as directed by your health care provider.  Do any rehabilitation or strengthening exercises as directed by your health care provider.  Rest your knee as directed by your health care provider. You may start doing your normal activities again when your health care provider approves.   Keep all follow-up visits as directed by your health care provider. This is important. SEEK MEDICAL CARE IF:  You have ongoing (persistent) pain in your knee. SEEK IMMEDIATE MEDICAL CARE IF:  You have increased swelling or redness of your knee.  You have severe pain in your knee.  You have a fever.   This information is not intended to replace advice given to you by your health care provider. Make sure you discuss any questions you have with your health care provider.   Document Released:  05/31/2003 Document Revised: 03/31/2014 Document Reviewed: 10/24/2013 Elsevier Interactive Patient Education [employer]

## 2015-03-27 NOTE — ED Notes (Signed)
Pt states that her R knee is swollen x 4-5 days. Hx of RA. Alert and oriented.

## 2015-03-27 NOTE — ED Provider Notes (Signed)
CSN: [id number]     Arrival date & time 03/27/15  1443 History  By signing my name below, I, Caitlin Miles, attest that this documentation has been prepared under the direction and in the presence of Caitlin Emery, PA-C Electronically Signed: Charline Miles, ED Scribe 03/27/2015 at 5:10 PM.   Chief Complaint  Patient presents with  . Joint Swelling   The history is provided by the patient. No language interpreter was used.   HPI Comments: Caitlin Miles is a 25 y.o. female, with a h/o rheumatoid arthritis, who presents to the Emergency Department complaining of persistent right knee swelling for the past 5 days, worsened since today. Pt denies trauma. She reports constant pain to the area that is exacerbated with ambulating. Pt usually takes prednisone and methotrexate for RA but states that she has been without methotrexate for a week due to an increase in the cost of her medication. She is currently uninsured but she receives charity care from [place] and will go pick up her medication this week. Pt  is followed by rheumatologist Dr. Artis Flock at [facility].  Past Medical History  Diagnosis Date  . Arthritis   . Rheumatoid arthritis(714.0)    Past Surgical History  Procedure Laterality Date  . No past surgeries     Family History  Problem Relation Age of Onset  . Rheum arthritis Mother   . Other Neg Hx    Social History  Substance Use Topics  . Smoking status: Current Every Day Smoker -- 0.50 packs/day    Types: Cigarettes  . Smokeless tobacco: Never Used  . Alcohol Use: No   OB History    Gravida Para Term Preterm AB TAB SAB Ectopic Multiple Living   1 0 0 0 1 1 0 0 0 0      Review of Systems A complete 10 system review of systems was obtained and all systems are negative except as noted in the HPI and PMH.   Allergies  Naproxen  Home Medications   Prior to Admission medications   Medication Sig Start Date End Date Taking? Authorizing Provider  clindamycin (CLEOCIN)  150 MG capsule Take 1 capsule (150 mg total) by mouth every 6 (six) hours. 11/07/14   11/09/14, PA-C  medroxyPROGESTERone (DEPO-PROVERA) 150 MG/ML injection Inject 150 mg into the muscle every 3 (three) months.    Historical Provider, MD  methotrexate 25 MG/ML SOLN Inject 25 mg into the skin once a week. 1 cc SQ ONCE a WEEK 01/26/14   Historical Provider, MD  oxyCODONE-acetaminophen (PERCOCET/ROXICET) 5-325 MG per tablet 1 to 2 tabs PO q6hrs  PRN for pain Patient not taking: Reported on 04/01/2014 07/12/12   07/14/12 Jaicob Dia, PA-C  predniSONE (DELTASONE) 5 MG tablet Take 10 mg by mouth every morning.     Historical Provider, MD  sulfamethoxazole-trimethoprim (SEPTRA DS) 800-160 MG per tablet Take 1 tablet by mouth every 12 (twelve) hours. 04/01/14   05/31/14, NP   BP 122/69 mmHg  Pulse 73  Temp(Src) 98.8 F (37.1 C) (Oral)  Resp 18  SpO2 100% Physical Exam  Constitutional: She is oriented to person, place, and time. She appears well-developed and well-nourished. No distress.  HENT:  Head: Normocephalic and atraumatic.  Eyes: Conjunctivae and EOM are normal.  Neck: Neck supple. No tracheal deviation present.  Cardiovascular: Normal rate.   Pulmonary/Chest: Effort normal. No stridor. No respiratory distress.  Musculoskeletal: Normal range of motion. She exhibits edema and tenderness.  Moderate effusion to right knee  with no warmth or overlying cellulitis, positive crepitance, positive mildly reduced active range of motion secondary to pain  Neurological: She is alert and oriented to person, place, and time.  Skin: Skin is warm and dry.  Psychiatric: She has a normal mood and affect. Her behavior is normal.  Nursing note and vitals reviewed.  ED Course  .Joint Aspiration/Arthrocentesis Date/Time: 03/27/2015 5:17 PM Performed by: Caitlin Miles Authorized by: Caitlin Miles Consent: Verbal consent obtained. Risks and benefits: risks, benefits and alternatives were  discussed Consent given by: patient Patient identity confirmed: verbally with patient Indications: pain and joint swelling  Body area: knee Joint: right knee Local anesthesia used: yes Anesthesia: local infiltration Local anesthetic: lidocaine 1% without epinephrine Anesthetic total: 3 ml Patient sedated: no Preparation: Patient was prepped and draped in the usual sterile fashion. Needle gauge: 18 G Ultrasound guidance: no Approach: lateral Aspirate: yellow and cloudy Aspirate amount: 10 mL Patient tolerance: Patient tolerated the procedure well with no immediate complications   (including critical care time) DIAGNOSTIC STUDIES: Oxygen Saturation is 100% on RA, normal by my interpretation.    COORDINATION OF CARE: 3:23 PM-Discussed treatment plan which includes joint aspiration with pt at bedside and pt agreed to plan.   Labs Review Labs Reviewed - No data to display  Imaging Review No results found. I have personally reviewed and evaluated these images and lab results as part of my medical decision-making.   EKG Interpretation None      MDM   Final diagnoses:  Knee effusion, right    Filed Vitals:   03/27/15 1514 03/27/15 1707  BP: 122/69 139/70  Pulse: 73 62  Temp: 98.8 F (37.1 C) 98.2 F (36.8 C)  TempSrc: Oral Oral  Resp: 18 18  SpO2: 100% 100%    Medications  lidocaine (PF) (XYLOCAINE) 1 % injection 5 mL (not administered)  lidocaine (XYLOCAINE) 1 % (with pres) injection (not administered)    Caitlin Miles is 25 y.o. female presenting with pain and right knee swelling consistent with prior RAD exacerbations, due to financial problems patient hasn't had her methotrexate in 1 week. Patient is ambulatory but with pain. She specifically requesting a therapeutic knee aspiration. I've advised her that this is not optimal, should be done in the orthopedist office. Patient is prone to infection because she is on chronic steroids and methotrexate, there is  no emergent indication to perform aspiration. Discussed with attending physician who agrees that if she is adamantly requesting aspiration can be performed in the ED. She understands the risks and benefits including intra-articular joint infection. Patient understands this, strict sterile procedure was utilized for arthrocentesis, 10 mL of cloudy yellow fluid was removed.   Evaluation does not show pathology that would require ongoing emergent intervention or inpatient treatment. Pt is hemodynamically stable and mentating appropriately. Discussed findings and plan with patient/guardian, who agrees with care plan. All questions answered. Return precautions discussed and outpatient follow up given.    I personally performed the services described in this documentation, which was scribed in my presence. The recorded information has been reviewed and is accurate.    Caitlin Emery, PA-C 03/27/15 1720  Lyndal Pulley, MD 03/29/15 [phone]

## 2015-06-13 ENCOUNTER — Emergency Department (HOSPITAL_COMMUNITY): Payer: BLUE CROSS/BLUE SHIELD

## 2015-06-13 ENCOUNTER — Encounter (HOSPITAL_COMMUNITY): Payer: Self-pay | Admitting: Family Medicine

## 2015-06-13 ENCOUNTER — Emergency Department (HOSPITAL_COMMUNITY)
Admission: EM | Admit: 2015-06-13 | Discharge: 2015-06-13 | Disposition: A | Discharge status: Home / Self Care | Payer: BLUE CROSS/BLUE SHIELD | Attending: Emergency Medicine | Admitting: Emergency Medicine

## 2015-06-13 DIAGNOSIS — Y9389 Activity, other specified: Secondary | ICD-10-CM | POA: Insufficient documentation

## 2015-06-13 DIAGNOSIS — F17210 Nicotine dependence, cigarettes, uncomplicated: Secondary | ICD-10-CM | POA: Diagnosis not present

## 2015-06-13 DIAGNOSIS — Z792 Long term (current) use of antibiotics: Secondary | ICD-10-CM | POA: Diagnosis not present

## 2015-06-13 DIAGNOSIS — S060X0A Concussion without loss of consciousness, initial encounter: Secondary | ICD-10-CM

## 2015-06-13 DIAGNOSIS — S8001XA Contusion of right knee, initial encounter: Secondary | ICD-10-CM | POA: Insufficient documentation

## 2015-06-13 DIAGNOSIS — M1990 Unspecified osteoarthritis, unspecified site: Secondary | ICD-10-CM | POA: Diagnosis not present

## 2015-06-13 DIAGNOSIS — Z7952 Long term (current) use of systemic steroids: Secondary | ICD-10-CM | POA: Insufficient documentation

## 2015-06-13 DIAGNOSIS — Y92410 Unspecified street and highway as the place of occurrence of the external cause: Secondary | ICD-10-CM | POA: Insufficient documentation

## 2015-06-13 DIAGNOSIS — M069 Rheumatoid arthritis, unspecified: Secondary | ICD-10-CM | POA: Insufficient documentation

## 2015-06-13 DIAGNOSIS — Y998 Other external cause status: Secondary | ICD-10-CM | POA: Insufficient documentation

## 2015-06-13 DIAGNOSIS — S0990XA Unspecified injury of head, initial encounter: Secondary | ICD-10-CM | POA: Diagnosis present

## 2015-06-13 MED ORDER — IBUPROFEN 200 MG PO TABS
600.0000 mg | ORAL_TABLET | Freq: Once | ORAL | Status: AC
  Administered 2015-06-13: 600 mg via ORAL
  Filled 2015-06-13: qty 1

## 2015-06-13 MED ORDER — HYDROCODONE-ACETAMINOPHEN 5-325 MG PO TABS: 1.0000 | ORAL_TABLET | Freq: Once | ORAL | Status: DC

## 2015-06-13 MED ORDER — ONDANSETRON 4 MG PO TBDP: 8.0000 mg | ORAL_TABLET | Freq: Once | ORAL | Status: DC

## 2015-06-13 MED ORDER — METHOCARBAMOL 500 MG PO TABS: 500.0000 mg | ORAL_TABLET | Freq: Two times a day (BID) | ORAL | Status: AC

## 2015-06-13 MED ORDER — IBUPROFEN 800 MG PO TABS: 800.0000 mg | ORAL_TABLET | Freq: Three times a day (TID) | ORAL | Status: AC

## 2015-06-13 MED ORDER — ONDANSETRON 4 MG PO TBDP: 4.0000 mg | ORAL_TABLET | Freq: Three times a day (TID) | ORAL | Status: AC | PRN

## 2015-06-13 NOTE — ED Notes (Signed)
Declined W/C at D/C and was escorted to lobby by RN. 

## 2015-06-13 NOTE — Discharge Instructions (Signed)
Your CT scan and your x-ray was normal. Please call your primary care provider to schedule a follow up appointment within one week. Return to the ER for new or worsening symptoms.   Concussion, Adult A concussion, or closed-head injury, is a brain injury caused by a direct blow to the head or by a quick and sudden movement (jolt) of the head or neck. Concussions are usually not life-threatening. Even so, the effects of a concussion can be serious. If you have had a concussion before, you are more likely to experience concussion-like symptoms after a direct blow to the head.  CAUSES  Direct blow to the head, such as from running into another player during a soccer game, being hit in a fight, or hitting your head on a hard surface.  A jolt of the head or neck that causes the brain to move back and forth inside the skull, such as in a car crash. SIGNS AND SYMPTOMS The signs of a concussion can be hard to notice. Early on, they may be missed by you, family members, and health care providers. You may look fine but act or feel differently. Symptoms are usually temporary, but they may last for days, weeks, or even longer. Some symptoms may appear right away while others may not show up for hours or days. Every head injury is different. Symptoms include:  Mild to moderate headaches that will not go away.  A feeling of pressure inside your head.  Having more trouble than usual:  Learning or remembering things you have heard.  Answering questions.  Paying attention or concentrating.  Organizing daily tasks.  Making decisions and solving problems.  Slowness in thinking, acting or reacting, speaking, or reading.  Getting lost or being easily confused.  Feeling tired all the time or lacking energy (fatigued).  Feeling drowsy.  Sleep disturbances.  Sleeping more than usual.  Sleeping less than usual.  Trouble falling asleep.  Trouble sleeping (insomnia).  Loss of balance or feeling  lightheaded or dizzy.  Nausea or vomiting.  Numbness or tingling.  Increased sensitivity to:  Sounds.  Lights.  Distractions.  Vision problems or eyes that tire easily.  Diminished sense of taste or smell.  Ringing in the ears.  Mood changes such as feeling sad or anxious.  Becoming easily irritated or angry for little or no reason.  Lack of motivation.  Seeing or hearing things other people do not see or hear (hallucinations). DIAGNOSIS Your health care provider can usually diagnose a concussion based on a description of your injury and symptoms. He or she will ask whether you passed out (lost consciousness) and whether you are having trouble remembering events that happened right before and during your injury. Your evaluation might include:  A brain scan to look for signs of injury to the brain. Even if the test shows no injury, you may still have a concussion.  Blood tests to be sure other problems are not present. TREATMENT  Concussions are usually treated in an emergency department, in urgent care, or at a clinic. You may need to stay in the hospital overnight for further treatment.  Tell your health care provider if you are taking any medicines, including prescription medicines, over-the-counter medicines, and natural remedies. Some medicines, such as blood thinners (anticoagulants) and aspirin, may increase the chance of complications. Also tell your health care provider whether you have had alcohol or are taking illegal drugs. This information may affect treatment.  Your health care provider will send you  home with important instructions to follow.  How fast you will recover from a concussion depends on many factors. These factors include how severe your concussion is, what part of your brain was injured, your age, and how healthy you were before the concussion.  Most people with mild injuries recover fully. Recovery can take time. In general, recovery is slower in  older persons. Also, persons who have had a concussion in the past or have other medical problems may find that it takes longer to recover from their current injury. HOME CARE INSTRUCTIONS General Instructions  Carefully follow the directions your health care provider gave you.  Only take over-the-counter or prescription medicines for pain, discomfort, or fever as directed by your health care provider.  Take only those medicines that your health care provider has approved.  Do not drink alcohol until your health care provider says you are well enough to do so. Alcohol and certain other drugs may slow your recovery and can put you at risk of further injury.  If it is harder than usual to remember things, write them down.  If you are easily distracted, try to do one thing at a time. For example, do not try to watch TV while fixing dinner.  Talk with family members or close friends when making important decisions.  Keep all follow-up appointments. Repeated evaluation of your symptoms is recommended for your recovery.  Watch your symptoms and tell others to do the same. Complications sometimes occur after a concussion. Older adults with a brain injury may have a higher risk of serious complications, such as a blood clot on the brain.  Tell your teachers, school nurse, school counselor, coach, athletic trainer, or work Production designer, theatre/television/film about your injury, symptoms, and restrictions. Tell them about what you can or cannot do. They should watch for:  Increased problems with attention or concentration.  Increased difficulty remembering or learning new information.  Increased time needed to complete tasks or assignments.  Increased irritability or decreased ability to cope with stress.  Increased symptoms.  Rest. Rest helps the brain to heal. Make sure you:  Get plenty of sleep at night. Avoid staying up late at night.  Keep the same bedtime hours on weekends and weekdays.  Rest during the day.  Take daytime naps or rest breaks when you feel tired.  Limit activities that require a lot of thought or concentration. These include:  Doing homework or job-related work.  Watching TV.  Working on the computer.  Avoid any situation where there is potential for another head injury (football, hockey, soccer, basketball, martial arts, downhill snow sports and horseback riding). Your condition will get worse every time you experience a concussion. You should avoid these activities until you are evaluated by the appropriate follow-up health care providers. Returning To Your Regular Activities You will need to return to your normal activities slowly, not all at once. You must give your body and brain enough time for recovery.  Do not return to sports or other athletic activities until your health care provider tells you it is safe to do so.  Ask your health care provider when you can drive, ride a bicycle, or operate heavy machinery. Your ability to react may be slower after a brain injury. Never do these activities if you are dizzy.  Ask your health care provider about when you can return to work or school. Preventing Another Concussion It is very important to avoid another brain injury, especially before you have recovered. In rare  cases, another injury can lead to permanent brain damage, brain swelling, or death. The risk of this is greatest during the first 7-10 days after a head injury. Avoid injuries by:  Wearing a seat belt when riding in a car.  Drinking alcohol only in moderation.  Wearing a helmet when biking, skiing, skateboarding, skating, or doing similar activities.  Avoiding activities that could lead to a second concussion, such as contact or recreational sports, until your health care provider says it is okay.  Taking safety measures in your home.  Remove clutter and tripping hazards from floors and stairways.  Use grab bars in bathrooms and handrails by stairs.  Place  non-slip mats on floors and in bathtubs.  Improve lighting in dim areas. SEEK MEDICAL CARE IF:  You have increased problems paying attention or concentrating.  You have increased difficulty remembering or learning new information.  You need more time to complete tasks or assignments than before.  You have increased irritability or decreased ability to cope with stress.  You have more symptoms than before. Seek medical care if you have any of the following symptoms for more than 2 weeks after your injury:  Lasting (chronic) headaches.  Dizziness or balance problems.  Nausea.  Vision problems.  Increased sensitivity to noise or light.  Depression or mood swings.  Anxiety or irritability.  Memory problems.  Difficulty concentrating or paying attention.  Sleep problems.  Feeling tired all the time. SEEK IMMEDIATE MEDICAL CARE IF:  You have severe or worsening headaches. These may be a sign of a blood clot in the brain.  You have weakness (even if only in one hand, leg, or part of the face).  You have numbness.  You have decreased coordination.  You vomit repeatedly.  You have increased sleepiness.  One pupil is larger than the other.  You have convulsions.  You have slurred speech.  You have increased confusion. This may be a sign of a blood clot in the brain.  You have increased restlessness, agitation, or irritability.  You are unable to recognize people or places.  You have neck pain.  It is difficult to wake you up.  You have unusual behavior changes.  You lose consciousness. MAKE SURE YOU:  Understand these instructions.  Will watch your condition.  Will get help right away if you are not doing well or get worse.   This information is not intended to replace advice given to you by your health care provider. Make sure you discuss any questions you have with your health care provider.   Document Released: 05/31/2003 Document Revised:  03/31/2014 Document Reviewed: 09/30/2012 Elsevier Interactive Patient Education [employer]

## 2015-06-13 NOTE — ED Notes (Signed)
PT refuses pain meds . PA informed
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
